# Patient Record
Sex: Female | Born: 1970 | Race: White | Hispanic: No | Marital: Married
Health system: Southern US, Community
[De-identification: ages and names within clinical notes are randomized; demographics above are authoritative.]

---

## 1999-11-15 ENCOUNTER — Other Ambulatory Visit: Admission: RE | Admit: 1999-11-15 | Discharge: 1999-11-15 | Payer: Self-pay | Admitting: Gynecology

## 2001-01-31 ENCOUNTER — Other Ambulatory Visit: Admission: RE | Admit: 2001-01-31 | Discharge: 2001-01-31 | Payer: Self-pay | Admitting: Obstetrics and Gynecology

## 2001-11-02 ENCOUNTER — Other Ambulatory Visit: Admission: RE | Admit: 2001-11-02 | Discharge: 2001-11-02 | Payer: Self-pay | Admitting: Obstetrics and Gynecology

## 2002-03-20 ENCOUNTER — Inpatient Hospital Stay (HOSPITAL_COMMUNITY): Admission: AD | Admit: 2002-03-20 | Discharge: 2002-03-20 | Payer: Self-pay | Admitting: Obstetrics and Gynecology

## 2002-04-05 ENCOUNTER — Inpatient Hospital Stay (HOSPITAL_COMMUNITY): Admission: AD | Admit: 2002-04-05 | Discharge: 2002-04-05 | Payer: Self-pay | Admitting: Obstetrics and Gynecology

## 2002-04-30 ENCOUNTER — Inpatient Hospital Stay (HOSPITAL_COMMUNITY): Admission: AD | Admit: 2002-04-30 | Discharge: 2002-05-02 | Payer: Self-pay | Admitting: Obstetrics and Gynecology

## 2002-05-20 ENCOUNTER — Encounter: Admission: RE | Admit: 2002-05-20 | Discharge: 2002-06-19 | Payer: Self-pay | Admitting: Obstetrics and Gynecology

## 2002-07-17 ENCOUNTER — Other Ambulatory Visit: Admission: RE | Admit: 2002-07-17 | Discharge: 2002-07-17 | Payer: Self-pay | Admitting: Obstetrics and Gynecology

## 2002-07-23 ENCOUNTER — Encounter: Payer: Self-pay | Admitting: Obstetrics and Gynecology

## 2002-07-23 ENCOUNTER — Encounter: Admission: RE | Admit: 2002-07-23 | Discharge: 2002-07-23 | Payer: Self-pay | Admitting: Obstetrics and Gynecology

## 2003-08-13 ENCOUNTER — Other Ambulatory Visit: Admission: RE | Admit: 2003-08-13 | Discharge: 2003-08-13 | Payer: Self-pay | Admitting: Obstetrics and Gynecology

## 2004-02-21 ENCOUNTER — Inpatient Hospital Stay (HOSPITAL_COMMUNITY): Admission: AD | Admit: 2004-02-21 | Discharge: 2004-02-21 | Payer: Self-pay | Admitting: Obstetrics & Gynecology

## 2004-02-24 ENCOUNTER — Inpatient Hospital Stay (HOSPITAL_COMMUNITY): Admission: AD | Admit: 2004-02-24 | Discharge: 2004-02-27 | Payer: Self-pay | Admitting: Obstetrics and Gynecology

## 2004-02-25 ENCOUNTER — Encounter (INDEPENDENT_AMBULATORY_CARE_PROVIDER_SITE_OTHER): Payer: Self-pay | Admitting: *Deleted

## 2004-03-30 ENCOUNTER — Other Ambulatory Visit: Admission: RE | Admit: 2004-03-30 | Discharge: 2004-03-30 | Payer: Self-pay | Admitting: Obstetrics and Gynecology

## 2005-01-11 ENCOUNTER — Encounter: Admission: RE | Admit: 2005-01-11 | Discharge: 2005-01-11 | Payer: Self-pay | Admitting: Interventional Radiology

## 2005-03-10 ENCOUNTER — Encounter: Admission: RE | Admit: 2005-03-10 | Discharge: 2005-03-10 | Payer: Self-pay | Admitting: Obstetrics and Gynecology

## 2005-05-27 ENCOUNTER — Other Ambulatory Visit: Admission: RE | Admit: 2005-05-27 | Discharge: 2005-05-27 | Payer: Self-pay | Admitting: Obstetrics and Gynecology

## 2006-04-11 ENCOUNTER — Encounter: Admission: RE | Admit: 2006-04-11 | Discharge: 2006-04-11 | Payer: Self-pay | Admitting: Family Medicine

## 2009-01-23 ENCOUNTER — Ambulatory Visit (HOSPITAL_COMMUNITY): Admission: RE | Admit: 2009-01-23 | Discharge: 2009-01-23 | Payer: Self-pay | Admitting: Obstetrics and Gynecology

## 2009-01-23 ENCOUNTER — Encounter (INDEPENDENT_AMBULATORY_CARE_PROVIDER_SITE_OTHER): Payer: Self-pay | Admitting: Obstetrics and Gynecology

## 2009-02-11 ENCOUNTER — Ambulatory Visit: Payer: Self-pay | Admitting: Genetic Counselor

## 2009-04-16 ENCOUNTER — Ambulatory Visit: Payer: Self-pay | Admitting: Genetic Counselor

## 2009-10-02 ENCOUNTER — Encounter: Admission: RE | Admit: 2009-10-02 | Discharge: 2009-10-02 | Payer: Self-pay | Admitting: Obstetrics and Gynecology

## 2009-10-15 ENCOUNTER — Encounter: Admission: RE | Admit: 2009-10-15 | Discharge: 2009-10-15 | Payer: Self-pay | Admitting: Obstetrics and Gynecology

## 2010-01-25 ENCOUNTER — Encounter (INDEPENDENT_AMBULATORY_CARE_PROVIDER_SITE_OTHER): Payer: Self-pay | Admitting: *Deleted

## 2010-02-10 ENCOUNTER — Ambulatory Visit: Payer: Self-pay | Admitting: Gastroenterology

## 2010-02-10 DIAGNOSIS — K921 Melena: Secondary | ICD-10-CM

## 2010-03-10 ENCOUNTER — Ambulatory Visit: Payer: Self-pay | Admitting: Gastroenterology

## 2010-07-04 ENCOUNTER — Encounter: Payer: Self-pay | Admitting: Obstetrics and Gynecology

## 2010-07-13 NOTE — Procedures (Signed)
Summary: Colonoscopy  Patient: Annette Hudson Note: All result statuses are Final unless otherwise noted.  Tests: (1) Colonoscopy (COL)   COL Colonoscopy           DONE     [facility]     [address]     [place], [place]  [address]           COLONOSCOPY PROCEDURE REPORT           PATIENT:  Annette, Hudson  MR#:  [id number]     BIRTHDATE:  1970/10/25, 39 yrs. old  GENDER:  female     ENDOSCOPIST:  Judie Petit T. Russella Dar, MD, [facility]     Referred by:  Harold Hedge, M.D.     PROCEDURE DATE:  03/10/2010     PROCEDURE:  Colon w/ inj sclerosis of hemorrhoids     ASA CLASS:  Class I     INDICATIONS:  1) hematochezia     MEDICATIONS:   Fentanyl 100 mcg IV, Versed 10 mg IV     DESCRIPTION OF PROCEDURE:   After the risks benefits and     alternatives of the procedure were thoroughly explained, informed     consent was obtained.  Digital rectal exam was performed and     revealed no abnormalities.   The LB [id number]-[id number] endoscope     was introduced through the anus and advanced to the cecum, which     was identified by both the appendix and ileocecal valve, without     limitations.  The quality of the prep was excellent, using     MoviPrep.  The instrument was then slowly withdrawn as the colon     was fully examined.     <<PROCEDUREIMAGES>>     FINDINGS:  A normal appearing cecum, ileocecal valve, and     appendiceal orifice were identified. The ascending, hepatic     flexure, transverse, splenic flexure, descending, sigmoid colon,     and rectum appeared unremarkable. Internal hemorrhoid was found on     retroflexed view. It was small. Injection sclerosis of hemorrhoids     w/ 23.4% saline with 1 cc injected well above the dentate line.     Retroflexed views in the rectum revealed no other findings other     than those already described. The time to cecum =  3  minutes. The     scope was then withdrawn (time =  9.5  min) from the patient and     the procedure  completed.           COMPLICATIONS:  None           ENDOSCOPIC IMPRESSION:     1) Normal colon     2) Internal hemorrhoid           RECOMMENDATIONS:     1) Continue current colorectal screening or "routine risk"     patients with a repeat colonoscopy in 10 years.     2) office visit as needed           Desmond Tufano T. Russella Dar, MD, [facility]           n.     eSIGNED:   Venita Lick. Harm Jou at 03/10/2010 02:55 PM           Annette, Hudson, [id number]  Note: An exclamation mark (!) indicates a result that was not dispersed into the flowsheet. Document Creation Date: 03/10/2010 2:56 PM _______________________________________________________________________  (1) Order result status: Final  Collection or observation date-time: 03/10/2010 14:48 Requested date-time:  Receipt date-time:  Reported date-time:  Referring Physician:   Ordering Physician: Claudette Head [phone]) Specimen Source:  Source: Annette Hudson Order Number: [id number] Lab site:   Appended Document: Colonoscopy    Clinical Lists Changes  Observations: Added new observation of COLONNXTDUE: 02/2020 (03/10/2010 15:46)

## 2010-07-13 NOTE — Letter (Signed)
Summary: New Patient letter  [facility]  [address] [place], [place] [address]   Phone: [phone]  Fax: [phone]       01/25/2010 MRN: [id number]  Annette Hudson [address] [place], [place]  [address]  Dear Ms. Crownover,  Welcome to the Gastroenterology Division at [facility].    You are scheduled to see Dr.  Russella Dar on 02-10-10 at 2:30p.m. on the 3rd floor at [facility], [address]  We ask that you try to arrive at our office 15 minutes prior to your appointment time to allow for check-in.  We would like you to complete the enclosed self-administered evaluation form prior to your visit and bring it with you on the day of your appointment.  We will review it with you.  Also, please bring a complete list of all your medications or, if you prefer, bring the medication bottles and we will list them.  Please bring your insurance card so that we may make a copy of it.  If your insurance requires a referral to see a specialist, please bring your referral form from your primary care physician.  Co-payments are due at the time of your visit and may be paid by cash, check or credit card.     Your office visit will consist of a consult with your physician (includes a physical exam), any laboratory testing he/she may order, scheduling of any necessary diagnostic testing (e.g. x-ray, ultrasound, CT-scan), and scheduling of a procedure (e.g. Endoscopy, Colonoscopy) if required.  Please allow enough time on your schedule to allow for any/all of these possibilities.    If you cannot keep your appointment, please call [phone] to cancel or reschedule prior to your appointment date.  This allows [place] the opportunity to schedule an appointment for another patient in need of care.  If you do not cancel or reschedule by 5 p.m. the business day prior to your appointment date, you will be charged a $50.00 late cancellation/no-show fee.    Thank you for  choosing [facility] for your medical needs.  We appreciate the opportunity to care for you.  Please visit [place] at our website  to learn more about our practice.                     Sincerely,                                                             The Gastroenterology Division

## 2010-07-13 NOTE — Letter (Signed)
Summary: [facility] Instructions  [facility]  [address] [place], [place] [address]   Phone: [phone]  Fax: [phone]       ORIA KLIMAS    [birth year]    MRN: [id number]        Procedure Day Dorna Bloom: Wednesday September 28th, 2011     Arrival Time: 3:00pm     Procedure Time: 4:00pm     Location of Procedure:                    _x _  [facility] (4th Floor)                        PREPARATION FOR COLONOSCOPY WITH MOVIPREP   Starting 5 days prior to your procedure 9/23/11do not eat nuts, seeds, popcorn, corn, beans, peas,  salads, or any raw vegetables.  Do not take any fiber supplements (e.g. Metamucil, Citrucel, and Benefiber).  THE DAY BEFORE YOUR PROCEDURE         DATE: 03/09/10  DAY: Tuesday  1.  Drink clear liquids the entire day-NO SOLID FOOD  2.  Do not drink anything colored red or purple.  Avoid juices with pulp.  No orange juice.  3.  Drink at least 64 oz. (8 glasses) of fluid/clear liquids during the day to prevent dehydration and help the prep work efficiently.  CLEAR LIQUIDS INCLUDE: Water Jello Ice Popsicles Tea (sugar ok, no milk/cream) Powdered fruit flavored drinks Coffee (sugar ok, no milk/cream) Gatorade Juice: apple, white grape, white cranberry  Lemonade Clear bullion, consomm, broth Carbonated beverages (any kind) Strained chicken noodle soup Hard Candy                             4.  In the morning, mix first dose of MoviPrep solution:    Empty 1 Pouch A and 1 Pouch B into the disposable container    Add lukewarm drinking water to the top line of the container. Mix to dissolve    Refrigerate (mixed solution should be used within 24 hrs)  5.  Begin drinking the prep at 5:00 p.m. The MoviPrep container is divided by 4 marks.   Every 15 minutes drink the solution down to the next mark (approximately 8 oz) until the full liter is complete.   6.  Follow completed prep with 16 oz of clear liquid of your  choice (Nothing red or purple).  Continue to drink clear liquids until bedtime.  7.  Before going to bed, mix second dose of MoviPrep solution:    Empty 1 Pouch A and 1 Pouch B into the disposable container    Add lukewarm drinking water to the top line of the container. Mix to dissolve    Refrigerate  THE DAY OF YOUR PROCEDURE      DATE: 03/10/10 DAY: Wednesday  Beginning at 11:00 a.m. (5 hours before procedure):         1. Every 15 minutes, drink the solution down to the next mark (approx 8 oz) until the full liter is complete.  2. Follow completed prep with 16 oz. of clear liquid of your choice.    3. You may drink clear liquids until 2:00pm (2 HOURS BEFORE PROCEDURE).   MEDICATION INSTRUCTIONS  Unless otherwise instructed, you should take regular prescription medications with a small sip of water   as early as possible the morning of your procedure.  OTHER INSTRUCTIONS  You will need a responsible adult at least 42 years of age to accompany you and drive you home.   This person must remain in the waiting room during your procedure.  Wear loose fitting clothing that is easily removed.  Leave jewelry and other valuables at home.  However, you may wish to bring a book to read or  an iPod/MP3 player to listen to music as you wait for your procedure to start.  Remove all body piercing jewelry and leave at home.  Total time from sign-in until discharge is approximately 2-3 hours.  You should go home directly after your procedure and rest.  You can resume normal activities the  day after your procedure.  The day of your procedure you should not:   Drive   Make legal decisions   Operate machinery   Drink alcohol   Return to work  You will receive specific instructions about eating, activities and medications before you leave.    The above instructions have been reviewed and explained to me by   _______________________    I fully understand and can  verbalize these instructions _____________________________ Date _________

## 2010-07-13 NOTE — Assessment & Plan Note (Signed)
Summary: RECTAL BLEEDING--CH.   History of Present Illness Visit Type: consult  Primary GI MD: Elie Goody MD [facility] Primary Provider: Guy Sandifer. Henderson Cloud, MD  Requesting Provider: Guy Sandifer. Henderson Cloud, MD  Chief Complaint: BRB in stool and in toilet after BMs History of Present Illness:   This is a 40 year old female, who relates recurrent problems with rectal bleeding. She notes moderate amounts of bright red blood per rectum with and without bowel movements for several weeks during July, and August. She has had similar symptoms in the past and states she had a flexible sigmoidoscopy in her 53s, that was unremarkable. She has no other gastrointestinal complaints. The bleeding stopped spontaneously about one week ago. Hemoglobin from 10/2009 was 13.1.   GI Review of Systems      Denies abdominal pain, acid reflux, belching, bloating, chest pain, dysphagia with liquids, dysphagia with solids, heartburn, loss of appetite, nausea, vomiting, vomiting blood, weight loss, and  weight gain.      Reports rectal bleeding.     Denies anal fissure, black tarry stools, change in bowel habit, constipation, diarrhea, diverticulosis, fecal incontinence, heme positive stool, hemorrhoids, irritable bowel syndrome, jaundice, light color stool, liver problems, and  rectal pain.   Current Medications (verified): 1)  None  Allergies (verified): No Known Drug Allergies  Past History:  Past Medical History: Reviewed history from 02/09/2010 and no changes required. Asthma  Past Surgical History: Ablation Appendectomy--40 years old   Family History: Family History of Colon Cancer:MGF Family History of Colon Polyps:Father  Family History of Ovarian Cancer:PGM  Social History: Physician Assistant  Married Childern Patient has never smoked.  Alcohol Use - yes 2 drinks per week Illicit Drug Use - no Daily Caffeine Use: 2 daily   Review of Systems       The pertinent positives and negatives are  noted as above and in the HPI. All other ROS were reviewed and were negative.   Vital Signs:  Patient profile:   40 year old female Height:      66 inches Weight:      186 pounds BMI:     30.13 BSA:     1.94 Pulse rate:   82 / minute Pulse rhythm:   regular BP sitting:   118 / 76  (left arm) Cuff size:   regular  Vitals Entered By: Ok Anis CMA (February 10, 2010 2:15 PM)  Physical Exam  General:  Well developed, well nourished, no acute distress. Head:  Normocephalic and atraumatic. Eyes:  PERRLA, no icterus. Ears:  Normal auditory acuity. Mouth:  No deformity or lesions, dentition normal. Neck:  Supple; no masses or thyromegaly. Lungs:  Clear throughout to auscultation. Heart:  Regular rate and rhythm; no murmurs, rubs,  or bruits. Abdomen:  Soft, nontender and nondistended. No masses, hepatosplenomegaly or hernias noted. Normal bowel sounds. Rectal:  Normal exam. hemocult negative.   Msk:  Symmetrical with no gross deformities. Normal posture. Pulses:  Normal pulses noted. Extremities:  No clubbing, cyanosis, edema or deformities noted. Neurologic:  Alert and  oriented x4;  grossly normal neurologically. Cervical Nodes:  No significant cervical adenopathy. Inguinal Nodes:  No significant inguinal adenopathy. Psych:  Alert and cooperative. Normal mood and affect.  Impression & Recommendations:  Problem # 1:  HEMATOCHEZIA (ICD-578.1) Intermittent, painless, moderate volume hematochezia. R/O internal hemorrhoids, proctitis, neoplasms. The risks, benefits and alternatives to colonoscopy with possible biopsy, possible destruction of hemorrhoids and possible polypectomy were discussed with the patient and they consent to  proceed. The procedure will be scheduled electively. Orders: Colonoscopy (Colon)  Patient Instructions: 1)  Pick up your prep from your pharmacy.  2)  Colonoscopy brochure given.  3)  Copy sent to : Guy Sandifer. Henderson Cloud, MD 4)  The medication list was  reviewed and reconciled.  All changed / newly prescribed medications were explained.  A complete medication list was provided to the patient / caregiver.  Prescriptions: MOVIPREP 100 GM  SOLR (PEG-KCL-NACL-NASULF-NA ASC-C) As per prep instructions.  #1 x 0   Entered by:   Christie Nottingham CMA (AAMA)   Authorized by:   Meryl Dare MD [facility]   Signed by:   Meryl Dare MD [facility] on 02/10/2010   Method used:   Electronically to        [facility] (retail)       [address]       [place], [place]  [address]       Ph: [phone] or [phone]       Fax: [phone]   RxID:   [id number]

## 2010-09-18 LAB — BASIC METABOLIC PANEL
BUN: 13 mg/dL (ref 6–23)
CO2: 25 mEq/L (ref 19–32)
Calcium: 8.5 mg/dL (ref 8.4–10.5)
Chloride: 108 mEq/L (ref 96–112)
Creatinine, Ser: 0.59 mg/dL (ref 0.4–1.2)
GFR calc Af Amer: >60 mL/min (ref >60)

## 2010-09-18 LAB — CBC
MCV: 90 fL (ref 78.0–100.0)
WBC: 8.8 10*3/uL (ref 4.0–10.5)

## 2010-10-26 NOTE — Op Note (Signed)
NAME:  Annette Hudson, Annette Hudson NO.:  [id number]   MEDICAL RECORD NO.:  [id number]          PATIENT TYPE:  AMB   LOCATION:  SDC                           FACILITY:  [facility]   PHYSICIAN:  Guy Sandifer. Henderson Cloud, M.D. DATE OF BIRTH:  Feb 16, 1971   DATE OF PROCEDURE:  01/23/2009  DATE OF DISCHARGE:                               OPERATIVE REPORT   PREOPERATIVE DIAGNOSIS:  Intermenstrual bleeding.   POSTOPERATIVE DIAGNOSIS:  Intermenstrual bleeding.   PROCEDURE:  Hysteroscopy, dilatation and curettage.   SURGEON:  Guy Sandifer. Henderson Cloud, MD   ANESTHESIA:  General with LMA.   ESTIMATED BLOOD LOSS:  Minimal.   SPECIMENS:  Endometrial curettings to Pathology.   I and O's of distending media 30 mL deficit.   INDICATIONS AND CONSENT:  This patient is a 40 year old married white  female G2, P2, husband status post vasectomy with intermenstrual  bleeding.  Details are dictated in the history and physical.  After  discussion of options, she was admitted for hysteroscopy, D and C.  Potential risks and complications have been discussed preoperatively  including but limited to infection, uterine perforation, organ damage,  bleeding requiring transfusion of blood products with HIV and hepatitis  acquisition, DVT, PE, pneumonia.  All questions were answered and  consent was signed on the chart.   FINDINGS:  No abnormal structures were noted.  Fallopian tube ostia  identified bilaterally.   DESCRIPTION OF PROCEDURE:  The patient was taken to operating room where  she was identified, placed in dorsal supine position, and general  anesthesia induced via LMA.  She was then placed in dorsal lithotomy  position where she was prepped, bladder straight catheterized, and  draped in sterile fashion.  A bivalve speculum was placed.  Anterior  cervical lip was injected with 1% plain Xylocaine and grasped with  single-tooth tenaculum.  Paracervical block of approximately 20 mL of  the same solution was  placed at 2,4,5,7,8, and 10 o'clock positions.  Cervix was gently progressively dilated.  Resectoscope was placed,  endocervical canal advanced under direct visualization.  The above  findings were noted.  Hysteroscope was withdrawn and sharp curettage was  carried out.  Hysteroscope was  replaced in the endocervical canal, again advanced under direct  visualization.  Cavity was clean.  Thorough inspection reveals no  abnormal structures.  Procedure was then terminated.  Instruments were  removed.  All counts were correct.  The patient was awakened and taken  to recovery room in stable condition.      Guy Sandifer Henderson Cloud, M.D.  Electronically Signed     JET/MEDQ  D:  01/23/2009  T:  01/23/2009  Job:  [id number]

## 2010-10-26 NOTE — H&P (Signed)
NAME:  Annette Hudson, GOSSARD NO.:  [id number]   MEDICAL RECORD NO.:  [id number]          PATIENT TYPE:  AMB   LOCATION:  SDC                           FACILITY:  [facility]   PHYSICIAN:  Guy Sandifer. Henderson Cloud, M.D. DATE OF BIRTH:  Apr 24, 1971   DATE OF ADMISSION:  DATE OF DISCHARGE:                              HISTORY & PHYSICAL   CHIEF COMPLAINT:  Irregular menses.   HISTORY OF PRESENT ILLNESS:  The patient is a 40 year old married white  female G2, P2, husband status post vasectomy, who has intermenstrual  bleeding over the last several months.  Ultrasound in my office on December 31, 2008, revealed a uterus measuring 8.7 x 4.1 x 5.8 cm.  Sonohysterogram is positive for a 8 x 15 mm polypoid mass in the  endometrial cavity.  After discussing options, she is being admitted for  hysteroscopy, D&C with resectoscope.  Potential risks and complications  have been discussed preoperatively.   PAST MEDICAL HISTORY:  Positive for asthma.   PAST SURGICAL HISTORY:  Left knee arthroscopy in 1991, septoplasty and  sinus surgery in 1989, wisdom tooth extraction in 1988, appendectomy in  1985.   OBSTETRICAL HISTORY:  Vaginal delivery x2.   SOCIAL HISTORY:  Denies drug or tobacco abuse.  Drinks two drinks of  alcohol per week.   MEDICATIONS:  None.   ALLERGIES:  Seldane leading to shortness of breath.   REVIEW OF SYSTEMS:  NEUROLOGIC:  Denies headache.  CARDIAC:  Denies  chest pain.  PULMONARY:  History of asthma as above.   PHYSICAL EXAMINATION:  VITAL SIGNS:  Height 5 feet 6 inches, weight  186.2 pounds, blood pressure 100/70.  HEENT:  Without thyromegaly.  LUNGS:  Clear to auscultation.  HEART:  Regular rate and rhythm.  BACK:  Without CVA tenderness.  ABDOMEN: Soft, nontender without masses.  PELVIC:  Not done.  Cervix without lesions.  Uterus, upper normal size,  mobile, nontender.  Adnexa nontender without masses.  EXTREMITIES:  Grossly within normal limits.  NEUROLOGIC:   Grossly within normal limits.   ASSESSMENT:  Intermenstrual bleeding.   PLAN:  Hysteroscopy, D&C with resectoscope.      Guy Sandifer Henderson Cloud, M.D.  Electronically Signed     JET/MEDQ  D:  01/13/2009  T:  01/14/2009  Job:  [id number]

## 2011-01-28 ENCOUNTER — Emergency Department (HOSPITAL_COMMUNITY)
Admission: EM | Admit: 2011-01-28 | Discharge: 2011-01-28 | Disposition: A | Discharge status: Home / Self Care | Payer: No Typology Code available for payment source | Attending: Emergency Medicine | Admitting: Emergency Medicine

## 2011-01-28 DIAGNOSIS — Z79899 Other long term (current) drug therapy: Secondary | ICD-10-CM | POA: Insufficient documentation

## 2011-01-28 DIAGNOSIS — J45909 Unspecified asthma, uncomplicated: Secondary | ICD-10-CM | POA: Insufficient documentation

## 2011-01-28 DIAGNOSIS — M545 Low back pain, unspecified: Secondary | ICD-10-CM | POA: Insufficient documentation

## 2011-01-28 DIAGNOSIS — S335XXA Sprain of ligaments of lumbar spine, initial encounter: Secondary | ICD-10-CM | POA: Insufficient documentation

## 2011-02-15 ENCOUNTER — Other Ambulatory Visit: Payer: Self-pay | Admitting: Physician Assistant

## 2012-12-19 ENCOUNTER — Other Ambulatory Visit: Payer: Self-pay | Admitting: Physician Assistant

## 2013-01-10 ENCOUNTER — Encounter (INDEPENDENT_AMBULATORY_CARE_PROVIDER_SITE_OTHER): Payer: BC Managed Care – PPO

## 2013-01-10 DIAGNOSIS — M7989 Other specified soft tissue disorders: Secondary | ICD-10-CM

## 2013-01-10 DIAGNOSIS — M79609 Pain in unspecified limb: Secondary | ICD-10-CM

## 2013-04-09 ENCOUNTER — Other Ambulatory Visit: Payer: Self-pay | Admitting: Physician Assistant

## 2013-04-09 DIAGNOSIS — Z1231 Encounter for screening mammogram for malignant neoplasm of breast: Secondary | ICD-10-CM

## 2013-05-14 ENCOUNTER — Ambulatory Visit
Admission: RE | Admit: 2013-05-14 | Discharge: 2013-05-14 | Disposition: A | Discharge status: Home / Self Care | Payer: BC Managed Care – PPO | Source: Ambulatory Visit | Attending: Physician Assistant | Admitting: Physician Assistant

## 2013-05-14 DIAGNOSIS — Z1231 Encounter for screening mammogram for malignant neoplasm of breast: Secondary | ICD-10-CM

## 2013-05-16 ENCOUNTER — Other Ambulatory Visit: Payer: Self-pay | Admitting: Physician Assistant

## 2013-05-16 DIAGNOSIS — R928 Other abnormal and inconclusive findings on diagnostic imaging of breast: Secondary | ICD-10-CM

## 2013-05-21 ENCOUNTER — Other Ambulatory Visit: Payer: BC Managed Care – PPO

## 2013-05-29 ENCOUNTER — Ambulatory Visit
Admission: RE | Admit: 2013-05-29 | Discharge: 2013-05-29 | Disposition: A | Discharge status: Home / Self Care | Payer: BC Managed Care – PPO | Source: Ambulatory Visit | Attending: Physician Assistant | Admitting: Physician Assistant

## 2013-05-29 DIAGNOSIS — R928 Other abnormal and inconclusive findings on diagnostic imaging of breast: Secondary | ICD-10-CM

## 2013-10-29 ENCOUNTER — Other Ambulatory Visit: Payer: Self-pay | Admitting: Dermatology

## 2014-01-29 ENCOUNTER — Encounter: Payer: Self-pay | Admitting: Gastroenterology

## 2014-02-04 ENCOUNTER — Other Ambulatory Visit: Payer: Self-pay | Admitting: Physician Assistant

## 2014-07-15 ENCOUNTER — Other Ambulatory Visit: Payer: Self-pay | Admitting: Physician Assistant

## 2014-07-15 DIAGNOSIS — Z1231 Encounter for screening mammogram for malignant neoplasm of breast: Secondary | ICD-10-CM

## 2014-07-21 ENCOUNTER — Ambulatory Visit
Admission: RE | Admit: 2014-07-21 | Discharge: 2014-07-21 | Disposition: A | Discharge status: Home / Self Care | Payer: BLUE CROSS/BLUE SHIELD | Source: Ambulatory Visit | Attending: Physician Assistant | Admitting: Physician Assistant

## 2014-07-21 DIAGNOSIS — Z1231 Encounter for screening mammogram for malignant neoplasm of breast: Secondary | ICD-10-CM

## 2015-10-02 ENCOUNTER — Other Ambulatory Visit: Payer: Self-pay | Admitting: Obstetrics and Gynecology

## 2015-10-02 DIAGNOSIS — R928 Other abnormal and inconclusive findings on diagnostic imaging of breast: Secondary | ICD-10-CM

## 2015-10-08 ENCOUNTER — Ambulatory Visit
Admission: RE | Admit: 2015-10-08 | Discharge: 2015-10-08 | Disposition: A | Discharge status: Home / Self Care | Payer: BLUE CROSS/BLUE SHIELD | Source: Ambulatory Visit | Attending: Obstetrics and Gynecology | Admitting: Obstetrics and Gynecology

## 2015-10-08 ENCOUNTER — Other Ambulatory Visit: Payer: Self-pay | Admitting: Obstetrics and Gynecology

## 2015-10-08 DIAGNOSIS — R928 Other abnormal and inconclusive findings on diagnostic imaging of breast: Secondary | ICD-10-CM

## 2016-10-13 ENCOUNTER — Other Ambulatory Visit: Payer: Self-pay | Admitting: Physician Assistant

## 2016-10-13 DIAGNOSIS — Z1231 Encounter for screening mammogram for malignant neoplasm of breast: Secondary | ICD-10-CM

## 2016-10-14 ENCOUNTER — Ambulatory Visit
Admission: RE | Admit: 2016-10-14 | Discharge: 2016-10-14 | Disposition: A | Discharge status: Home / Self Care | Payer: BLUE CROSS/BLUE SHIELD | Source: Ambulatory Visit | Attending: Physician Assistant | Admitting: Physician Assistant

## 2016-10-14 DIAGNOSIS — Z1231 Encounter for screening mammogram for malignant neoplasm of breast: Secondary | ICD-10-CM

## 2018-11-26 ENCOUNTER — Other Ambulatory Visit: Payer: Self-pay | Admitting: Orthopedic Surgery

## 2019-04-28 IMAGING — MG 2D DIGITAL SCREENING BILATERAL MAMMOGRAM WITH CAD AND ADJUNCT TO
8 of 12 series · 8 of 28 positions shown · non-contrast
Comparison: Previous exam(s).

CLINICAL DATA: Screening.

EXAM:
2D DIGITAL SCREENING BILATERAL MAMMOGRAM WITH CAD AND ADJUNCT TOMO

[R CC]
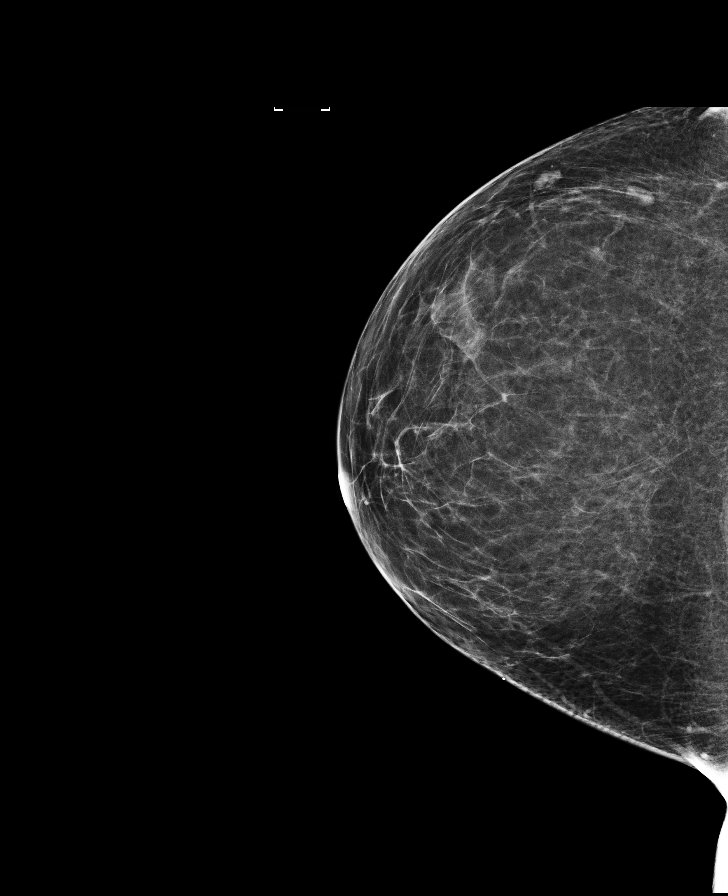

[L MLO synth-2D]
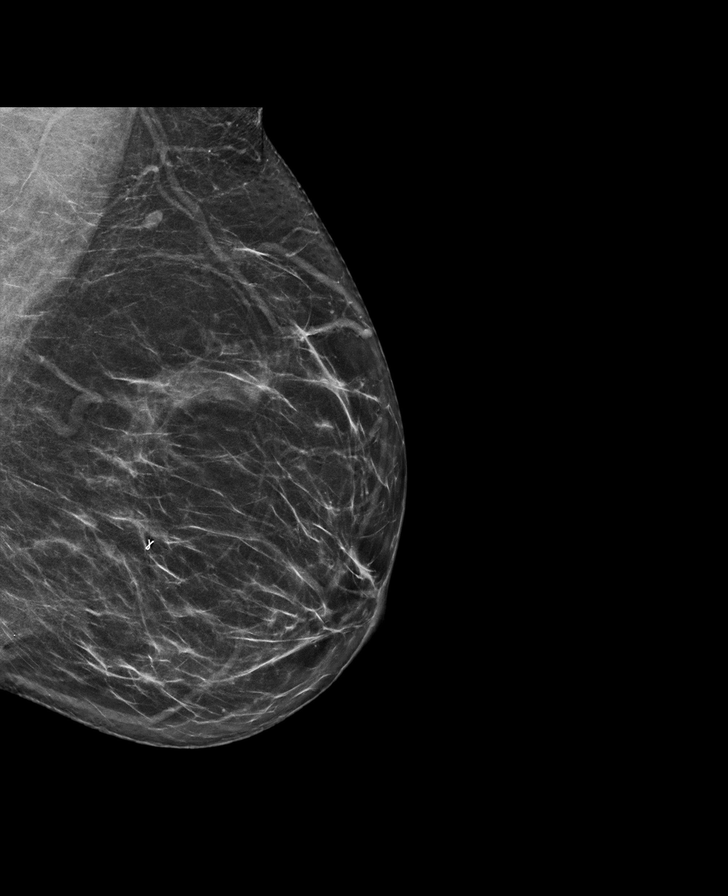

[L CC]
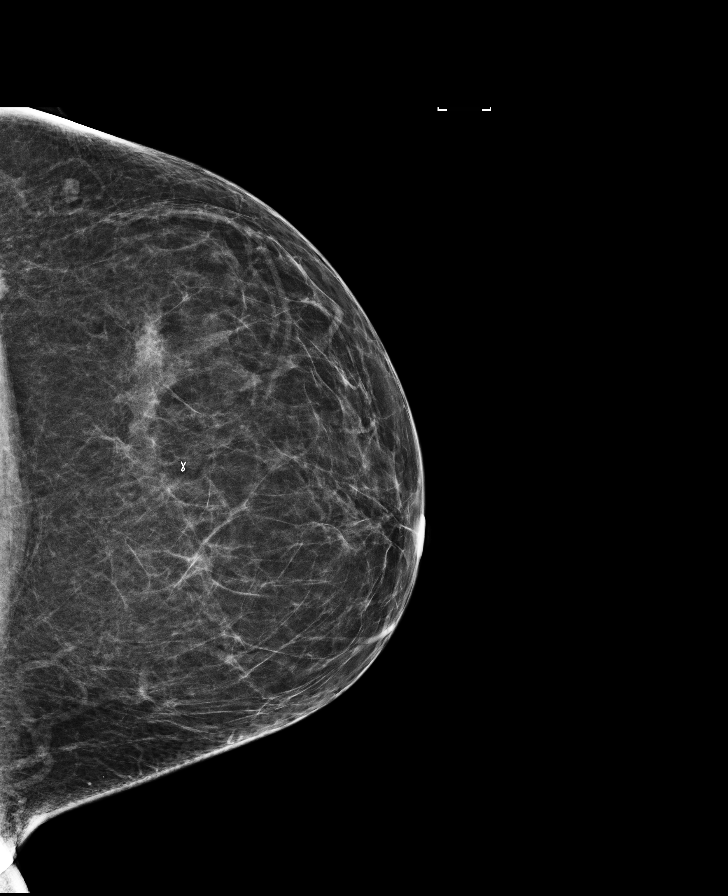

[L CC synth-2D]
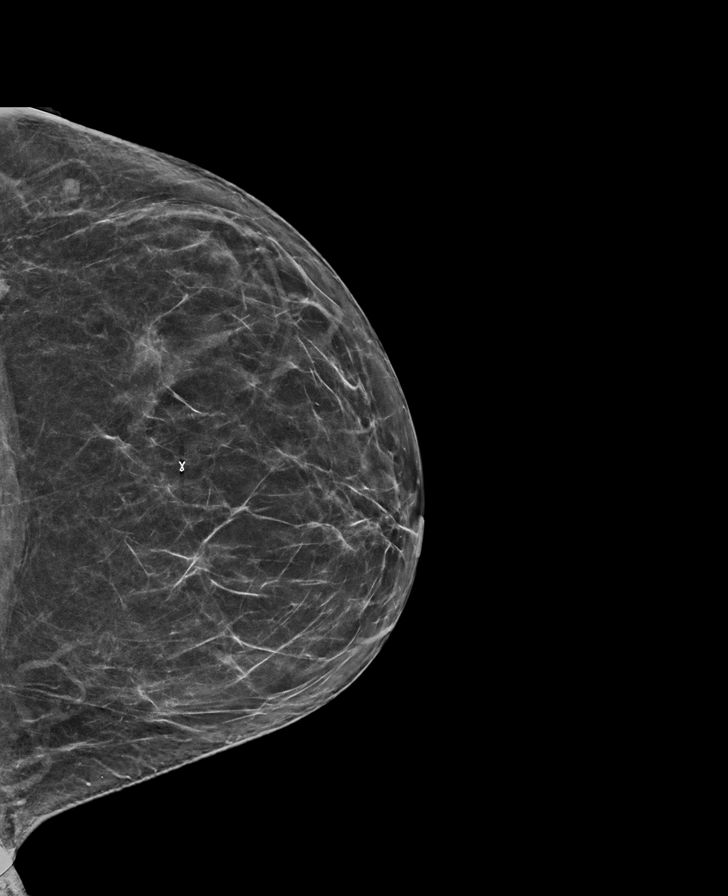

[R MLO synth-2D]
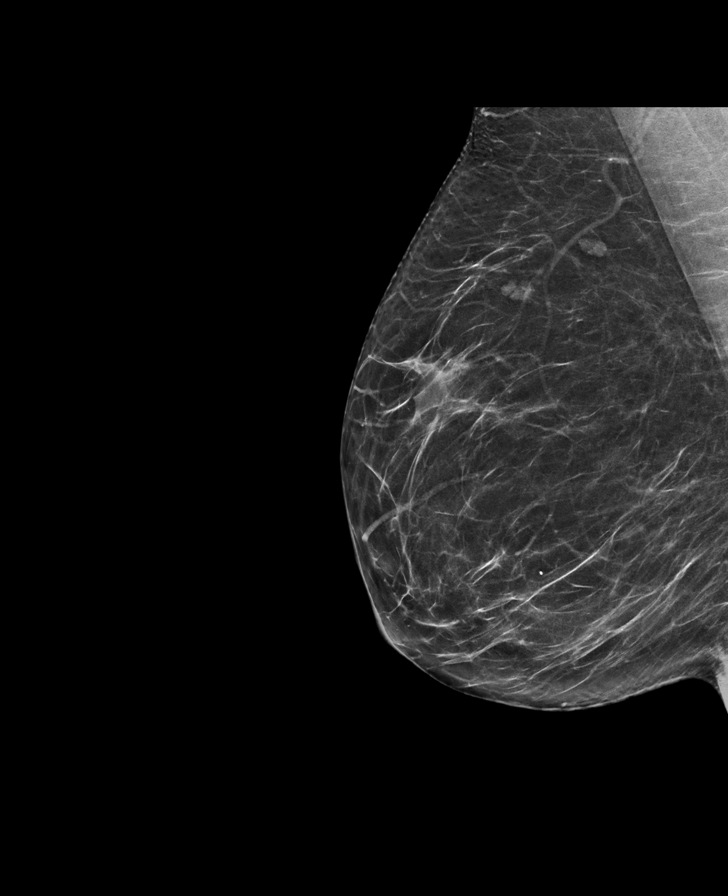

[R CC synth-2D]
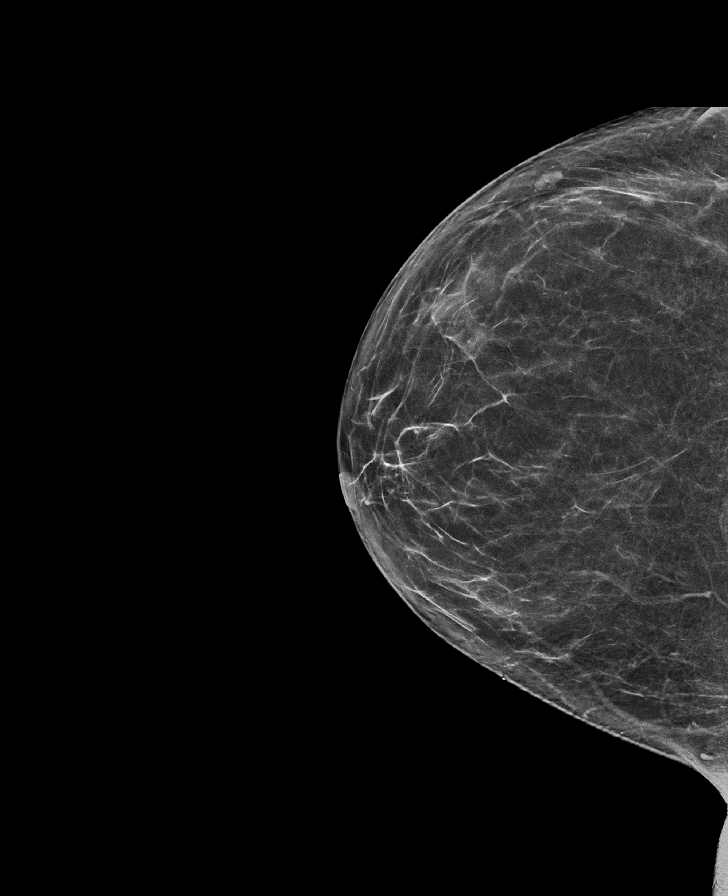

[L MLO]
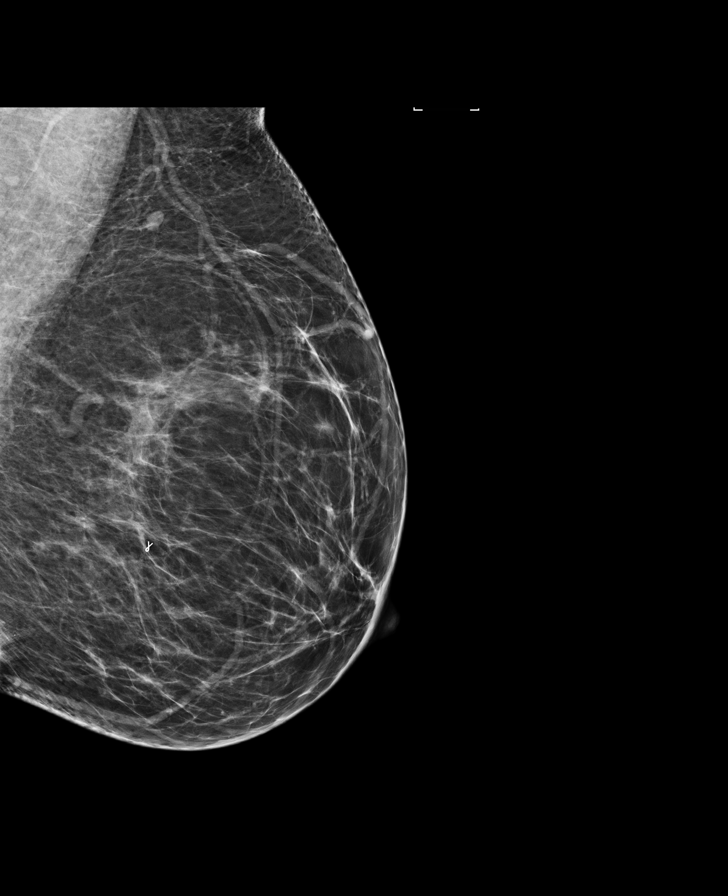

[R MLO]
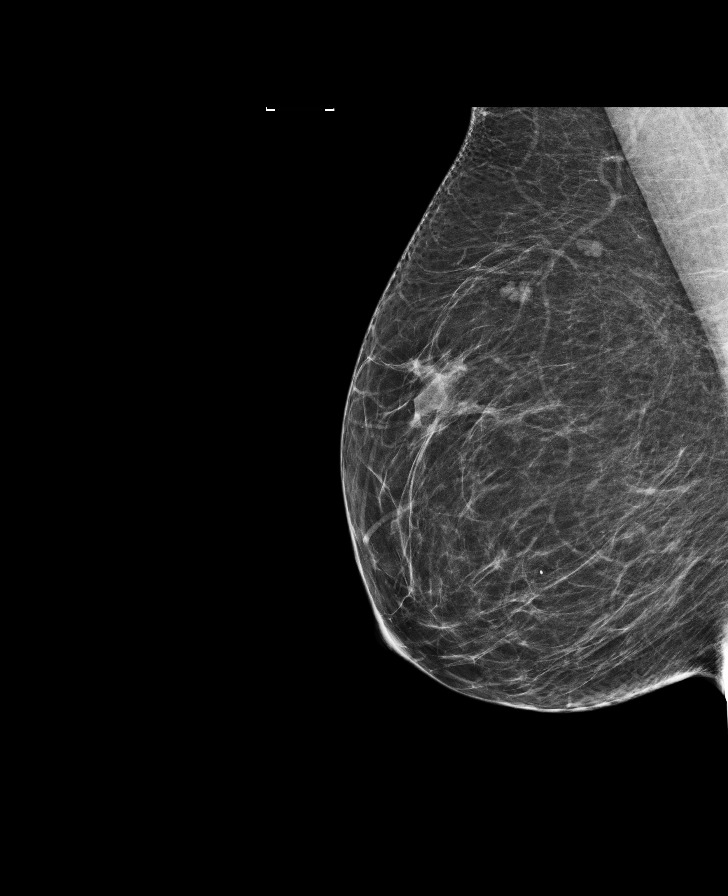

[8 of 28 positions shown; findings below may reference images not displayed]

ACR Breast Density Category b: There are scattered areas of
fibroglandular density.
FINDINGS: There are no findings suspicious for malignancy. Images were
processed with CAD.
IMPRESSION: No mammographic evidence of malignancy. A result letter of this
screening mammogram will be mailed directly to the patient.

RECOMMENDATION:
Screening mammogram in one year. (Code:97-6-RS4)

BI-RADS CATEGORY  1: Negative.

## 2020-01-20 ENCOUNTER — Other Ambulatory Visit: Payer: Self-pay | Admitting: Gastroenterology

## 2020-07-22 ENCOUNTER — Encounter: Payer: Self-pay | Admitting: Gastroenterology

## 2021-02-09 ENCOUNTER — Other Ambulatory Visit: Payer: Self-pay

## 2021-02-09 ENCOUNTER — Other Ambulatory Visit (HOSPITAL_BASED_OUTPATIENT_CLINIC_OR_DEPARTMENT_OTHER): Payer: Self-pay

## 2021-02-09 ENCOUNTER — Ambulatory Visit: Payer: BLUE CROSS/BLUE SHIELD | Attending: Internal Medicine

## 2021-02-09 DIAGNOSIS — Z23 Encounter for immunization: Secondary | ICD-10-CM

## 2021-02-09 MED ORDER — PFIZER-BIONT COVID-19 VAC-TRIS 30 MCG/0.3ML IM SUSP
INTRAMUSCULAR | 0 refills | Status: AC
  Filled 2021-02-09: qty 0.3, 1d supply, fill #0

## 2021-02-09 NOTE — Progress Notes (Signed)
   Covid-19 Vaccination Clinic  Name:  Annette Hudson    MRN: [id number] DOB: 22-Oct-1970  02/09/2021  Annette Hudson was observed post Covid-19 immunization for 15 minutes without incident. She was provided with Vaccine Information Sheet and instruction to access the V-Safe system.   Annette Hudson was instructed to call 911 with any severe reactions post vaccine: Difficulty breathing  Swelling of face and throat  A fast heartbeat  A bad rash all over body  Dizziness and weakness   Immunizations Administered     Name Date Dose VIS Date Route   PFIZER Comrnaty(Gray TOP) Covid-19 Vaccine 02/09/2021  3:02 PM 0.3 mL 05/21/2020 Intramuscular   Manufacturer: [employer], [employer]   Lot: [id number]   NDC: [phone]

## 2022-03-16 ENCOUNTER — Other Ambulatory Visit (HOSPITAL_BASED_OUTPATIENT_CLINIC_OR_DEPARTMENT_OTHER): Payer: Self-pay

## 2022-03-16 MED ORDER — INFLUENZA VAC SPLIT QUAD 0.5 ML IM SUSY
PREFILLED_SYRINGE | INTRAMUSCULAR | 0 refills | Status: AC
  Filled 2022-03-16: qty 0.5, 1d supply, fill #0

## 2022-03-23 ENCOUNTER — Other Ambulatory Visit (HOSPITAL_BASED_OUTPATIENT_CLINIC_OR_DEPARTMENT_OTHER): Payer: Self-pay

## 2022-03-23 MED ORDER — COVID-19 MRNA 2023-2024 VACCINE (COMIRNATY) 0.3 ML INJECTION
INTRAMUSCULAR | 0 refills | Status: AC
  Filled 2022-03-23: qty 0.3, 1d supply, fill #0

## 2023-04-05 ENCOUNTER — Other Ambulatory Visit (HOSPITAL_BASED_OUTPATIENT_CLINIC_OR_DEPARTMENT_OTHER): Payer: Self-pay

## 2023-04-05 MED ORDER — COVID-19 MRNA VAC-TRIS(PFIZER) 30 MCG/0.3ML IM SUSY
0.3000 mL | PREFILLED_SYRINGE | Freq: Once | INTRAMUSCULAR | 0 refills | Status: AC
  Filled 2023-04-05: qty 0.3, 1d supply, fill #0

## 2023-04-19 ENCOUNTER — Other Ambulatory Visit (HOSPITAL_BASED_OUTPATIENT_CLINIC_OR_DEPARTMENT_OTHER): Payer: Self-pay

## 2024-03-20 ENCOUNTER — Other Ambulatory Visit (HOSPITAL_BASED_OUTPATIENT_CLINIC_OR_DEPARTMENT_OTHER): Payer: Self-pay

## 2024-03-20 MED ORDER — COMIRNATY 30 MCG/0.3ML IM SUSY
0.3000 mL | PREFILLED_SYRINGE | Freq: Once | INTRAMUSCULAR | 0 refills | Status: AC
  Filled 2024-03-20: qty 0.3, 1d supply, fill #0
